# Patient Record
Sex: Male | Born: 1980 | Race: White | Hispanic: No | Smoking: Never smoker
Health system: Southern US, Community
[De-identification: ages and names within clinical notes are randomized; demographics above are authoritative.]

## PROBLEM LIST (undated history)

## (undated) DIAGNOSIS — F1910 Other psychoactive substance abuse, uncomplicated: Secondary | ICD-10-CM

## (undated) DIAGNOSIS — J939 Pneumothorax, unspecified: Secondary | ICD-10-CM

## (undated) DIAGNOSIS — E119 Type 2 diabetes mellitus without complications: Secondary | ICD-10-CM

## (undated) HISTORY — PX: APPENDECTOMY: SHX54

## (undated) HISTORY — PX: HAND SURGERY: SHX662

---

## 2018-05-03 ENCOUNTER — Encounter (HOSPITAL_COMMUNITY): Payer: Self-pay | Admitting: Emergency Medicine

## 2018-05-03 ENCOUNTER — Emergency Department (HOSPITAL_COMMUNITY)
Admission: EM | Admit: 2018-05-03 | Discharge: 2018-05-03 | Disposition: A | Discharge status: Home / Self Care | Payer: Medicaid Other | Attending: Emergency Medicine | Admitting: Emergency Medicine

## 2018-05-03 DIAGNOSIS — Z5321 Procedure and treatment not carried out due to patient leaving prior to being seen by health care provider: Secondary | ICD-10-CM | POA: Diagnosis not present

## 2018-05-03 DIAGNOSIS — F1010 Alcohol abuse, uncomplicated: Secondary | ICD-10-CM | POA: Diagnosis not present

## 2018-05-03 DIAGNOSIS — F1910 Other psychoactive substance abuse, uncomplicated: Secondary | ICD-10-CM | POA: Insufficient documentation

## 2018-05-03 NOTE — ED Triage Notes (Signed)
Per pt, states he was sent here by [facility] for Detox-states he has been using Fentanyl, cocaine, and ETOH

## 2018-05-12 ENCOUNTER — Other Ambulatory Visit: Payer: Self-pay

## 2018-05-12 ENCOUNTER — Emergency Department (HOSPITAL_COMMUNITY)
Admission: EM | Admit: 2018-05-12 | Discharge: 2018-05-13 | Disposition: A | Discharge status: Home / Self Care | Payer: Medicaid Other | Attending: Emergency Medicine | Admitting: Emergency Medicine

## 2018-05-12 ENCOUNTER — Emergency Department (HOSPITAL_COMMUNITY): Payer: Medicaid Other

## 2018-05-12 ENCOUNTER — Encounter (HOSPITAL_COMMUNITY): Payer: Self-pay | Admitting: Emergency Medicine

## 2018-05-12 DIAGNOSIS — J209 Acute bronchitis, unspecified: Secondary | ICD-10-CM | POA: Diagnosis not present

## 2018-05-12 DIAGNOSIS — R0602 Shortness of breath: Secondary | ICD-10-CM | POA: Diagnosis present

## 2018-05-12 HISTORY — DX: Pneumothorax, unspecified: J93.9

## 2018-05-12 LAB — CBC WITH DIFFERENTIAL/PLATELET
BASOS ABS: 0 10*3/uL (ref 0.0–0.1)
Basophils Relative: 0 %
EOS PCT: 1 %
Eosinophils Absolute: 0.2 10*3/uL (ref 0.0–0.7)
HEMATOCRIT: 41.3 % (ref 39.0–52.0)
Hemoglobin: 14.2 g/dL (ref 13.0–17.0)
LYMPHS ABS: 1.9 10*3/uL (ref 0.7–4.0)
LYMPHS PCT: 18 %
MCH: 31.3 pg (ref 26.0–34.0)
MCHC: 34.4 g/dL (ref 30.0–36.0)
MCV: 91.2 fL (ref 78.0–100.0)
MONO ABS: 0.6 10*3/uL (ref 0.1–1.0)
MONOS PCT: 6 %
NEUTROS ABS: 7.8 10*3/uL — AB (ref 1.7–7.7)
Neutrophils Relative %: 75 %
Platelets: 174 10*3/uL (ref 150–400)
RBC: 4.53 MIL/uL (ref 4.22–5.81)
RDW: 12.7 % (ref 11.5–15.5)
WBC: 10.4 10*3/uL (ref 4.0–10.5)

## 2018-05-12 MED ORDER — METHYLPREDNISOLONE SODIUM SUCC 125 MG IJ SOLR
125.0000 mg | Freq: Once | INTRAMUSCULAR | Status: AC
  Administered 2018-05-12: 125 mg via INTRAVENOUS
  Filled 2018-05-12: qty 2

## 2018-05-12 MED ORDER — ALBUTEROL SULFATE (2.5 MG/3ML) 0.083% IN NEBU
2.5000 mg | INHALATION_SOLUTION | Freq: Once | RESPIRATORY_TRACT | Status: AC
  Administered 2018-05-12: 2.5 mg via RESPIRATORY_TRACT
  Filled 2018-05-12: qty 3

## 2018-05-12 MED ORDER — IPRATROPIUM-ALBUTEROL 0.5-2.5 (3) MG/3ML IN SOLN
3.0000 mL | Freq: Once | RESPIRATORY_TRACT | Status: AC
  Administered 2018-05-12: 3 mL via RESPIRATORY_TRACT
  Filled 2018-05-12: qty 3

## 2018-05-12 NOTE — ED Notes (Signed)
Bed: [id number] Expected date:  Expected time:  Means of arrival:  Comments: EMS 37 yo male weak and dizzy from work SOB/wheezing-neb treatment

## 2018-05-12 NOTE — ED Provider Notes (Signed)
[facility]-EMERGENCY DEPT Provider Note   CSN: [id number] Arrival date & time: 05/12/18  2307     History   Chief Complaint Chief Complaint  Patient presents with  . Dizziness  . Weakness  . Shortness of Breath    HPI Dillon Carson is a 37 y.o. male.  Patient presents to the emergency department for evaluation of chest tightness, shortness of breath, wheezing and cough.  Symptoms began this afternoon while at work.  Patient reports that he has nasal congestion and he has been coughing up thick sputum.  Every time he tries to take a deep breath it causes him to cough and he feels like he cannot catch his breath.  He has not had a fever.     Past Medical History:  Diagnosis Date  . Pneumothorax     There are no active problems to display for this patient.       Home Medications    Prior to Admission medications   Medication Sig Start Date End Date Taking? Authorizing Provider  benzonatate (TESSALON) 100 MG capsule Take 1 capsule (100 mg total) by mouth every 8 (eight) hours. 05/13/18   Gilda CreasePollina, Estrella Alcaraz J, MD  predniSONE (DELTASONE) 20 MG tablet Take 3 tablets (60 mg total) by mouth daily with breakfast. 05/13/18   Joleene Burnham, Canary Brimhristopher J, MD    Family History No family history on file.  Social History Social History   Tobacco Use  . Smoking status: Not on file  Substance Use Topics  . Alcohol use: Yes  . Drug use: Yes    Types: Cocaine    Comment: fentalnyl     Allergies   Penicillins   Review of Systems Review of Systems  Respiratory: Positive for cough, chest tightness and shortness of breath.   All other systems reviewed and are negative.    Physical Exam Updated Vital Signs BP 118/76   Pulse 84   Temp 98.3 F (36.8 C) (Oral)   Resp 16   Ht 6\' 1"  (1.854 m)   Wt 77.1 kg   SpO2 93%   BMI 22.43 kg/m   Physical Exam  Constitutional: He is oriented to person, place, and time. He appears well-developed and  well-nourished. No distress.  HENT:  Head: Normocephalic and atraumatic.  Right Ear: Hearing normal.  Left Ear: Hearing normal.  Nose: Nose normal.  Mouth/Throat: Oropharynx is clear and moist and mucous membranes are normal.  Eyes: Pupils are equal, round, and reactive to light. Conjunctivae and EOM are normal.  Neck: Normal range of motion. Neck supple.  Cardiovascular: Regular rhythm, S1 normal and S2 normal. Exam reveals no gallop and no friction rub.  No murmur heard. Pulmonary/Chest: Effort normal. No respiratory distress. He has decreased breath sounds. He has wheezes (throughout). He exhibits no tenderness.  Abdominal: Soft. Normal appearance and bowel sounds are normal. There is no hepatosplenomegaly. There is no tenderness. There is no rebound, no guarding, no tenderness at McBurney's point and negative Murphy's sign. No hernia.  Musculoskeletal: Normal range of motion.  Neurological: He is alert and oriented to person, place, and time. He has normal strength. No cranial nerve deficit or sensory deficit. Coordination normal. GCS eye subscore is 4. GCS verbal subscore is 5. GCS motor subscore is 6.  Skin: Skin is warm, dry and intact. No rash noted. No cyanosis.  Psychiatric: He has a normal mood and affect. His speech is normal and behavior is normal. Thought content normal.  Nursing note and vitals  reviewed.    ED Treatments / Results  Labs (all labs ordered are listed, but only abnormal results are displayed) Labs Reviewed  CBC WITH DIFFERENTIAL/PLATELET - Abnormal; Notable for the following components:      Result Value   Neutro Abs 7.8 (*)    All other components within normal limits  BASIC METABOLIC PANEL - Abnormal; Notable for the following components:   Potassium 3.4 (*)    Glucose, Bld 138 (*)    All other components within normal limits  I-STAT TROPONIN, ED    EKG None  Radiology Dg Chest 2 View  Result Date: 05/13/2018 CLINICAL DATA:  Cough and shortness  of breath EXAM: CHEST - 2 VIEW COMPARISON:  None. FINDINGS: Monitoring leads overlie the patient. Normal cardiac and mediastinal contours. Bilateral mid and lower lung interstitial opacities. No pleural effusion or pneumothorax. Osseous structures unremarkable. IMPRESSION: Interstitial opacities mid and lower lungs bilaterally may represent bronchitis. Electronically Signed   By: Annia Belt M.D.   On: 05/13/2018 00:05    Procedures Procedures (including critical care time)  Medications Ordered in ED Medications  albuterol (PROVENTIL HFA;VENTOLIN HFA) 108 (90 Base) MCG/ACT inhaler 2 puff (has no administration in time range)  methylPREDNISolone sodium succinate (SOLU-MEDROL) 125 mg/2 mL injection 125 mg (125 mg Intravenous Given 05/12/18 2340)  ipratropium-albuterol (DUONEB) 0.5-2.5 (3) MG/3ML nebulizer solution 3 mL (3 mLs Nebulization Given 05/12/18 2357)  albuterol (PROVENTIL) (2.5 MG/3ML) 0.083% nebulizer solution 2.5 mg (2.5 mg Nebulization Given 05/12/18 2358)     Initial Impression / Assessment and Plan / ED Course  I have reviewed the triage vital signs and the nursing notes.  Pertinent labs & imaging results that were available during my care of the patient were reviewed by me and considered in my medical decision making (see chart for details).     Patient presents to the emergency department for evaluation of shortness of breath.  Patient has active wheezing and bronchospasm evident on examination.  No evidence of pneumonia on x-ray.  Blood work unremarkable.  Cardiac evaluation unremarkable.  Will treat for acute bronchitis.  Final Clinical Impressions(s) / ED Diagnoses   Final diagnoses:  Acute bronchitis, unspecified organism    ED Discharge Orders         Ordered    predniSONE (DELTASONE) 20 MG tablet  Daily with breakfast     05/13/18 0113    benzonatate (TESSALON) 100 MG capsule  Every 8 hours     05/13/18 0113           Gilda Crease, MD 05/13/18  [phone]

## 2018-05-12 NOTE — ED Triage Notes (Signed)
While patient was at work, around [employer]3pm, he noticed weakness and dizziness. The weakness and dizziness worsened around 9 pm and he also began to have shortness of breath and coughed up yellow sputum. He reports his roommate has also had a productive cough. Along with the cough he reports chest tightness and wheezing was noted. Albuterol and solumedrol was administered by EMS.

## 2018-05-13 LAB — BASIC METABOLIC PANEL
ANION GAP: 10 (ref 5–15)
BUN: 12 mg/dL (ref 6–20)
CALCIUM: 9.2 mg/dL (ref 8.9–10.3)
CO2: 25 mmol/L (ref 22–32)
Chloride: 105 mmol/L (ref 98–111)
Creatinine, Ser: 0.89 mg/dL (ref 0.61–1.24)
GFR calc Af Amer: >60 mL/min (ref >60)
GFR calc non Af Amer: >60 mL/min (ref >60)
GLUCOSE: 138 mg/dL — AB (ref 70–99)
Potassium: 3.4 mmol/L — ABNORMAL LOW (ref 3.5–5.1)
Sodium: 140 mmol/L (ref 135–145)

## 2018-05-13 LAB — I-STAT TROPONIN, ED: Troponin i, poc: 0 ng/mL (ref 0.00–0.08)

## 2018-05-13 MED ORDER — PREDNISONE 20 MG PO TABS: 60.0000 mg | ORAL_TABLET | Freq: Every day | ORAL | 0 refills | Status: DC

## 2018-05-13 MED ORDER — ALBUTEROL SULFATE HFA 108 (90 BASE) MCG/ACT IN AERS
2.0000 | INHALATION_SPRAY | RESPIRATORY_TRACT | Status: DC | PRN
  Administered 2018-05-13: 2 via RESPIRATORY_TRACT
  Filled 2018-05-13: qty 6.7

## 2018-05-13 MED ORDER — BENZONATATE 100 MG PO CAPS: 100.0000 mg | ORAL_CAPSULE | Freq: Three times a day (TID) | ORAL | 0 refills | Status: DC

## 2018-06-30 ENCOUNTER — Encounter (HOSPITAL_COMMUNITY): Payer: Self-pay | Admitting: Emergency Medicine

## 2018-06-30 ENCOUNTER — Emergency Department (HOSPITAL_COMMUNITY)
Admission: EM | Admit: 2018-06-30 | Discharge: 2018-06-30 | Discharge status: Against Medical Advice | Payer: Medicaid Other | Attending: Emergency Medicine | Admitting: Emergency Medicine

## 2018-06-30 ENCOUNTER — Other Ambulatory Visit: Payer: Self-pay

## 2018-06-30 ENCOUNTER — Emergency Department (HOSPITAL_COMMUNITY): Payer: Medicaid Other

## 2018-06-30 DIAGNOSIS — R112 Nausea with vomiting, unspecified: Secondary | ICD-10-CM | POA: Diagnosis not present

## 2018-06-30 DIAGNOSIS — R197 Diarrhea, unspecified: Secondary | ICD-10-CM | POA: Insufficient documentation

## 2018-06-30 HISTORY — DX: Other psychoactive substance abuse, uncomplicated: F19.10

## 2018-06-30 MED ORDER — ONDANSETRON HCL 4 MG/2ML IJ SOLN
4.0000 mg | INTRAMUSCULAR | Status: DC | PRN
  Filled 2018-06-30: qty 2

## 2018-06-30 MED ORDER — SODIUM CHLORIDE 0.9 % IV BOLUS: 1000.0000 mL | Freq: Once | INTRAVENOUS | Status: DC

## 2018-06-30 MED ORDER — DICYCLOMINE HCL 10 MG/ML IM SOLN
20.0000 mg | Freq: Once | INTRAMUSCULAR | Status: DC
  Filled 2018-06-30: qty 2

## 2018-06-30 MED ORDER — FAMOTIDINE IN NACL 20-0.9 MG/50ML-% IV SOLN
20.0000 mg | Freq: Once | INTRAVENOUS | Status: DC
  Filled 2018-06-30: qty 50

## 2018-06-30 NOTE — Discharge Instructions (Addendum)
Increase your fluid intake (ie:  Gatoraide) for the next few days, as discussed.  Eat a bland diet and advance to your regular diet slowly as you can tolerate it.   Avoid full strength juices, as well as milk and milk products until your diarrhea has resolved.   Call your regular medical doctor Monday to schedule a follow up appointment this week.  Return to the Emergency Department immediately sooner if you change your mind regarding evaluation.

## 2018-06-30 NOTE — ED Triage Notes (Signed)
Pt states that yesterday he felt like he had a cold. Pt states this morning at 0300, he started throwing up and having diarrhea. Pt states 3 episodes of emesis, 1130p-5am diarrhea "nonstop". Pt states he has been taking loperamide without relief.  Pt states that he is also out of his albuterol inhaler.

## 2018-06-30 NOTE — ED Provider Notes (Signed)
[facility]-EMERGENCY DEPT Provider Note   CSN: [id number] Arrival date & time: 06/30/18  1044     History   Chief Complaint Chief Complaint  Patient presents with  . Emesis  . Diarrhea    HPI Dillon Carson is a 37 y.o. male.  HPI  Pt was seen at 1150. Per pt, c/o gradual onset and persistence of multiple intermittent episodes of N/V/D that began last night approximately 2300. Describes the stools as "watery."  Pt states his roommate recently had the same symptoms. Pt also states he has had "a cough for a while" and has run out of his albuterol MDI. Pt is requesting a refill of his MDI. Denies abd pain, no CP/SOB, no back pain, no fevers, no black or blood in stools or emesis.    Past Medical History:  Diagnosis Date  . Pneumothorax   . Polysubstance abuse (HCC)     There are no active problems to display for this patient.   Past Surgical History:  Procedure Laterality Date  . APPENDECTOMY    . HAND SURGERY          Home Medications    Prior to Admission medications   Not on File    Family History No family history on file.  Social History Social History   Tobacco Use  . Smoking status: Never Smoker  . Smokeless tobacco: Never Used  Substance Use Topics  . Alcohol use: Yes  . Drug use: Not Currently    Types: Cocaine    Comment: pt denies 10/19     Allergies   Penicillins   Review of Systems Review of Systems ROS: Statement: All systems negative except as marked or noted in the HPI; Constitutional: Negative for fever and chills. ; ; Eyes: Negative for eye pain, redness and discharge. ; ; ENMT: Negative for ear pain, hoarseness, nasal congestion, sinus pressure and sore throat. ; ; Cardiovascular: Negative for chest pain, palpitations, diaphoresis, dyspnea and peripheral edema. ; ; Respiratory: +cough. Negative for wheezing and stridor. ; ; Gastrointestinal: +N/V/D. Negative for abdominal pain, blood in stool, hematemesis,  jaundice and rectal bleeding. . ; ; Genitourinary: Negative for dysuria, flank pain and hematuria. ; ; Musculoskeletal: Negative for back pain and neck pain. Negative for swelling and trauma.; ; Skin: Negative for pruritus, rash, abrasions, blisters, bruising and skin lesion.; ; Neuro: Negative for headache, lightheadedness and neck stiffness. Negative for weakness, altered level of consciousness, altered mental status, extremity weakness, paresthesias, involuntary movement, seizure and syncope.       Physical Exam Updated Vital Signs BP 119/85 (BP Location: Right Arm)   Pulse 74   Temp 97.6 F (36.4 C) (Oral)   Resp 16   Ht 6\' 1"  (1.854 m)   Wt 84.8 kg   SpO2 98%   BMI 24.67 kg/m   Physical Exam 1155: Physical examination:  Nursing notes reviewed; Vital signs and O2 SAT reviewed;  Constitutional: Well developed, Well nourished, Well hydrated, In no acute distress; Head:  Normocephalic, atraumatic; Eyes: EOMI, PERRL, No scleral icterus; ENMT: Mouth and pharynx normal, Mucous membranes moist; Neck: Supple, Full range of motion, No lymphadenopathy; Cardiovascular: Regular rate and rhythm, No gallop; Respiratory: Breath sounds clear & equal bilaterally, No wheezes.  Speaking full sentences with ease, Normal respiratory effort/excursion; Chest: Nontender, Movement normal; Abdomen: Soft, Nontender, Nondistended, Normal bowel sounds; Genitourinary: No CVA tenderness; Extremities: Peripheral pulses normal, No tenderness, No edema, No calf edema or asymmetry.; Neuro: AA&Ox3, Major CN grossly intact.  Speech clear. No gross focal motor or sensory deficits in extremities.; Skin: Color normal, Warm, Dry.   ED Treatments / Results  Labs (all labs ordered are listed, but only abnormal results are displayed)   EKG None  Radiology   Procedures Procedures (including critical care time)  Medications Ordered in ED Medications  sodium chloride 0.9 % bolus 1,000 mL (has no administration in time  range)  famotidine (PEPCID) IVPB 20 mg premix (has no administration in time range)  dicyclomine (BENTYL) injection 20 mg (has no administration in time range)  ondansetron (ZOFRAN) injection 4 mg (has no administration in time range)     Initial Impression / Assessment and Plan / ED Course  I have reviewed the triage vital signs and the nursing notes.  Pertinent labs & imaging results that were available during my care of the patient were reviewed by me and considered in my medical decision making (see chart for details).  MDM Reviewed: previous chart, nursing note and vitals Reviewed previous: labs    1245:  Pt does not want to stay, stating to ED RN that he needed to leave right now. ED RN and I encouraged pt to stay, continues to refuse.  Pt makes his own medical decisions.  Risks of AMA explained to pt, including, but not limited to: bowel obstruction, colitis, pneumonia, pneumothorax, stroke, heart attack, cardiac arrythmia ("irregular heart rate/beat"), "passing out," temporary and/or permanent disability, death.  Pt verb understanding and continues to refuse to stay for further evaluation, understanding the consequences of his decision.  I encouraged pt to follow up with his PMD tomorrow and return to the ED immediately if symptoms worsen, or for any other concerns.  Pt verb understanding, agreeable.    Final Clinical Impressions(s) / ED Diagnoses   Final diagnoses:  None    ED Discharge Orders    None       Samuel Jester, DO 07/01/18 6578

## 2019-02-27 ENCOUNTER — Other Ambulatory Visit: Payer: Self-pay

## 2019-02-27 ENCOUNTER — Encounter (HOSPITAL_COMMUNITY): Payer: Self-pay

## 2019-02-27 ENCOUNTER — Emergency Department (HOSPITAL_COMMUNITY)
Admission: EM | Admit: 2019-02-27 | Discharge: 2019-02-27 | Disposition: A | Discharge status: Home / Self Care | Payer: Medicaid Other | Attending: Emergency Medicine | Admitting: Emergency Medicine

## 2019-02-27 DIAGNOSIS — E162 Hypoglycemia, unspecified: Secondary | ICD-10-CM | POA: Insufficient documentation

## 2019-02-27 LAB — CBG MONITORING, ED: Glucose-Capillary: 109 mg/dL — ABNORMAL HIGH (ref 70–99)

## 2019-02-27 MED ORDER — FREESTYLE LANCETS MISC: 12 refills | Status: AC

## 2019-02-27 NOTE — ED Triage Notes (Signed)
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 Patient states he does not have any lancets or oral glucose tabs for a while. Patient states he drank [facility] and CBG went to 157 and he felt better. Patient states his job made him come and get a work note.  CBG in triage-109.

## 2019-02-27 NOTE — ED Provider Notes (Signed)
[facility]-EMERGENCY DEPT Provider Note   CSN: [id number] Arrival date & time: 02/27/19  1145    History   Chief Complaint Chief Complaint  Patient presents with  . Hypoglycemia    HPI Dillon Carson is a 38 y.o. male.     38yo male presents for hypoglycemic episode while at work today. Patient does not have diabetes, has hypoglycemic episodes and carries a glucometer and glucose tabs with him. 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 No complaints at this time, denies fevers, sick symptoms or other complaints.      Past Medical History:  Diagnosis Date  . Pneumothorax   . Polysubstance abuse (HCC)     There are no active problems to display for this patient.   Past Surgical History:  Procedure Laterality Date  . APPENDECTOMY    . HAND SURGERY          Home Medications    Prior to Admission medications   Medication Sig Start Date End Date Taking? Authorizing Provider  Lancets (FREESTYLE) lancets Use as instructed 02/27/19   Jeannie FendMurphy, Serina Nichter A, PA-C    Family History Family History  Problem Relation Age of Onset  . Diabetes Mother   . Heart failure Mother     Social History Social History   Tobacco Use  . Smoking status: Never Smoker  . Smokeless tobacco: Never Used  Substance Use Topics  . Alcohol use: Not Currently  . Drug use: Not Currently    Types: Cocaine    Comment: Patient  states he is clean x 26 months     Allergies   Penicillins   Review of Systems Review of Systems  Constitutional: Negative for fever.  Gastrointestinal: Negative for nausea and vomiting.  Skin: Negative for rash and wound.  Allergic/Immunologic: Negative for immunocompromised state.  Neurological: Negative for dizziness and weakness.  All other systems reviewed and are negative.     Physical Exam Updated Vital Signs BP (!) 127/95 (BP Location: Left Arm)   Pulse 82   Temp 98 F (36.7 C) (Oral)   Resp 17   Ht 6\' 2"  (1.88 m)   Wt 88.5 kg   SpO2 100%   BMI 25.04 kg/m   Physical Exam Vitals signs and nursing note reviewed.  Constitutional:      General: He is not in acute distress.    Appearance: He is well-developed. He is not diaphoretic.  HENT:     Head: Normocephalic and atraumatic.  Cardiovascular:     Rate and Rhythm: Normal rate and regular rhythm.     Heart sounds: Normal heart sounds.  Pulmonary:     Effort: Pulmonary effort is normal.     Breath sounds: Normal breath sounds.  Skin:    General: Skin is warm and dry.  Neurological:     Mental Status: He is alert and oriented to person, place, and time.  Psychiatric:        Behavior: Behavior normal.      ED Treatments / Results  Labs (all labs ordered are listed, but only abnormal results are displayed) Labs Reviewed  CBG MONITORING, ED - Abnormal; Notable for the following components:      Result Value   Glucose-Capillary 109 (*)    All other components within normal limits  EKG None  Radiology No results found.  Procedures Procedures (including critical care time)  Medications Ordered in ED Medications - No data to display   Initial Impression / Assessment and Plan / ED Course  I have reviewed the triage vital signs and the nursing notes.  Pertinent labs & imaging results that were available during my care of the patient were reviewed by me and considered in my medical decision making (see chart for details).  Clinical Course as of Feb 26 1325  Wed Feb 26, 7886  2155 38 year old male with complaint of hypoglycemic episode while at work today.  Patient is out of lancets, unable to check his blood sugar when the episode happened however drinking [facility] and feels better at this time however requires note for his employer to build to return to work.  Patient was given  prescription for lancets, he will carry glucose tabs and candies with him.  Glucose in triage was 109.  Patient would like to be discharged at this time.   [LM]    Clinical Course User Index [LM] Tacy Learn, PA-C      Final Clinical Impressions(s) / ED Diagnoses   Final diagnoses:  Hypoglycemia    ED Discharge Orders         Ordered    Lancets (FREESTYLE) lancets     02/27/19 1321           Tacy Learn, PA-C 02/27/19 [place], [place], DO 02/28/19 [phone]

## 2019-02-27 NOTE — Discharge Instructions (Signed)
Be sure to keep plenty of glucose tabs, candy, sugar packets available for hypoglycemic events. Follow up with your doctor as needed.

## 2020-01-05 ENCOUNTER — Encounter (HOSPITAL_COMMUNITY): Payer: Self-pay | Admitting: Emergency Medicine

## 2020-01-05 ENCOUNTER — Other Ambulatory Visit: Payer: Self-pay

## 2020-01-05 DIAGNOSIS — R109 Unspecified abdominal pain: Secondary | ICD-10-CM | POA: Insufficient documentation

## 2020-01-05 DIAGNOSIS — Z5321 Procedure and treatment not carried out due to patient leaving prior to being seen by health care provider: Secondary | ICD-10-CM | POA: Insufficient documentation

## 2020-01-05 DIAGNOSIS — R21 Rash and other nonspecific skin eruption: Secondary | ICD-10-CM | POA: Insufficient documentation

## 2020-01-05 MED ORDER — SODIUM CHLORIDE 0.9% FLUSH: 3.0000 mL | Freq: Once | INTRAVENOUS | Status: DC

## 2020-01-05 NOTE — ED Triage Notes (Signed)
Pt reports after laying down he gets bad pressure and feels like has to burp then reports spits up bright red blood. Reports been taking Tums without relief. Also has rash on bilat thighs for week. Pt also takes lots of Goody powders.

## 2020-01-06 ENCOUNTER — Emergency Department (HOSPITAL_COMMUNITY)
Admission: EM | Admit: 2020-01-06 | Discharge: 2020-01-06 | Disposition: A | Discharge status: Home / Self Care | Payer: Medicaid Other | Attending: Emergency Medicine | Admitting: Emergency Medicine

## 2020-02-04 ENCOUNTER — Encounter (HOSPITAL_COMMUNITY): Payer: Self-pay | Admitting: Emergency Medicine

## 2020-02-04 ENCOUNTER — Emergency Department (HOSPITAL_COMMUNITY)
Admission: EM | Admit: 2020-02-04 | Discharge: 2020-02-04 | Disposition: A | Discharge status: Home / Self Care | Payer: Medicaid Other | Attending: Emergency Medicine | Admitting: Emergency Medicine

## 2020-02-04 ENCOUNTER — Other Ambulatory Visit: Payer: Self-pay

## 2020-02-04 DIAGNOSIS — E162 Hypoglycemia, unspecified: Secondary | ICD-10-CM

## 2020-02-04 LAB — CBG MONITORING, ED: Glucose-Capillary: 105 mg/dL — ABNORMAL HIGH (ref 70–99)

## 2020-02-04 MED ORDER — SODIUM CHLORIDE 0.9 % IV BOLUS
1000.0000 mL | Freq: Once | INTRAVENOUS | Status: AC
  Administered 2020-02-04: 1000 mL via INTRAVENOUS

## 2020-02-04 NOTE — ED Triage Notes (Signed)
Patient here from work reporting hypoglycemic episode. Reports hx of same. States that he took 2 glucose tabs and rechecked sugar. Went up to 104. Also reports that BP was 206/100s.

## 2020-02-04 NOTE — ED Provider Notes (Signed)
[facility]-EMERGENCY DEPT Provider Note   CSN: [id number] Arrival date & time: 02/04/20  1130     History Chief Complaint  Patient presents with  . Hypoglycemia  . Hypertension    Dillon Carson is a 39 y.o. male.  HPI 39 year old male with history of polysubstance abuse, glycemia presents to the ER for a hypoglycemic episode.  Patient states that he woke up this morning in his usual state of health and went to work.  He did not eat breakfast this morning.  Shortly after he arrived to work, he started to feel weak, dizzy and lightheaded which is consistent with how he feels when he is hypoglycemic.  He then stated that he took 2 glucose tablets, ate some cheese and crackers, tolerated and walked around work.  Patient started noting improvement in his symptoms.  He took his blood sugar and states that it was 206.  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  He denies any hypertension as per triage notes, states that they misunderstood him.  He notes improvement in his symptoms, though he does state that he still feels a bit dehydrated.  He denies any fevers, chills, nausea, vomiting, abdominal pain.  He states that he has episodes of hypoglycemia, though his last episode was approximately 5 months ago as he has been pretty good about monitoring it.    Past Medical History:  Diagnosis Date  . Pneumothorax   . Polysubstance abuse (HCC)     There are no problems to display for this patient.   Past Surgical History:  Procedure Laterality Date  . APPENDECTOMY    . HAND SURGERY         Family History  Problem Relation Age of Onset  . Diabetes Mother   . Heart failure Mother     Social History   Tobacco Use  . Smoking status: Never Smoker  . Smokeless tobacco: Never Used  Substance Use Topics  . Alcohol use: Not Currently  . Drug use: Not Currently    Types: Cocaine    Comment:  Patient  states he is clean x 26 months    Home Medications Prior to Admission medications   Medication Sig Start Date End Date Taking? Authorizing Provider  Lancets (FREESTYLE) lancets Use as instructed 02/27/19   Jeannie Fend, PA-C    Allergies    Penicillins  Review of Systems   Review of Systems  Constitutional: Negative for chills and fever.  HENT: Negative for ear pain and sore throat.   Eyes: Negative for pain and visual disturbance.  Respiratory: Negative for cough and shortness of breath.   Cardiovascular: Negative for chest pain and palpitations.  Gastrointestinal: Negative for abdominal pain, nausea and vomiting.  Genitourinary: Negative for dysuria and hematuria.  Musculoskeletal: Negative for arthralgias and back pain.  Skin: Negative for color change and rash.  Neurological: Positive for dizziness, weakness and light-headedness. Negative for seizures and syncope.  Psychiatric/Behavioral: Negative for confusion.  All other systems reviewed and are negative.   Physical Exam Updated Vital Signs BP (!) 146/95   Pulse 66   Temp 98.4 F (36.9 C) (Oral)   Resp 18   SpO2 97%   Physical Exam Vitals and nursing note reviewed.  Constitutional:      General: He is not in acute distress.    Appearance: Normal appearance. He is well-developed and normal  weight. He is not ill-appearing, toxic-appearing or diaphoretic.  HENT:     Head: Normocephalic and atraumatic.     Nose: Nose normal.     Mouth/Throat:     Mouth: Mucous membranes are moist.     Pharynx: Oropharynx is clear.  Eyes:     Conjunctiva/sclera: Conjunctivae normal.     Pupils: Pupils are equal, round, and reactive to light.  Cardiovascular:     Rate and Rhythm: Normal rate and regular rhythm.     Pulses: Normal pulses.     Heart sounds: Normal heart sounds. No murmur.  Pulmonary:     Effort: Pulmonary effort is normal. No respiratory distress.     Breath sounds: Normal breath sounds.  Abdominal:       General: Abdomen is flat.     Palpations: Abdomen is soft.     Tenderness: There is no abdominal tenderness.  Musculoskeletal:        General: No tenderness.     Cervical back: Neck supple.     Right lower leg: No edema.     Left lower leg: No edema.  Skin:    General: Skin is warm and dry.     Capillary Refill: Capillary refill takes less than 2 seconds.  Neurological:     General: No focal deficit present.     Mental Status: He is alert and oriented to person, place, and time.  Psychiatric:        Mood and Affect: Mood normal.        Behavior: Behavior normal.     ED Results / Procedures / Treatments   Labs (all labs ordered are listed, but only abnormal results are displayed) Labs Reviewed  CBG MONITORING, ED - Abnormal; Notable for the following components:      Result Value   Glucose-Capillary 105 (*)    All other components within normal limits    EKG EKG Interpretation  Date/Time:  Tuesday Feb 04 2020 12:35:57 EDT Ventricular Rate:  66 PR Interval:    QRS Duration: 85 QT Interval:  375 QTC Calculation: 393 R Axis:   73 Text Interpretation: Sinus rhythm 12 Lead; Mason-Likar unremarkable ECG Confirmed by Gerhard Munch [phone]) on 02/04/2020 12:43:04 PM   Radiology No results found.  Procedures Procedures (including critical care time)  Medications Ordered in ED Medications  sodium chloride 0.9 % bolus 1,000 mL (1,000 mLs Intravenous Bolus from Bag 02/04/20 1304)    ED Course  I have reviewed the triage vital signs and the nursing notes.  Pertinent labs & imaging results that were available during my care of the patient were reviewed by me and considered in my medical decision making (see chart for details).    MDM Rules/Calculators/A&P                      39 year old male with hypoglycemic episode presents to the ER. Presentation to the ER, he patient is alert and oriented, no acute distress, nontoxic appearing.  Physical exam without  abdominal tenderness or any acute abnormality.  Vitals overall reassuring, blood pressure 146/95.  EKG normal sinus rhythm.  Blood sugar improved to 104 in the ER.  Patient reports improvement of symptoms, will give fluid bolus as he states he still feels dehydrated.  No evidence of hyperglycemic crisis.  Patient does not have a PCP, encouraged him to establish with one through the phone number on his discharge paperwork.  Encouraged follow-up with the PCP.  Patient voices understanding  and is agreeable to this plan.  At this stage in the ED course the patient is medically screened and is stable for discharge.  Return precautions given.   Final Clinical Impression(s) / ED Diagnoses Final diagnoses:  Hypoglycemia    Rx / DC Orders ED Discharge Orders    None       Lyndel Safe 02/04/20 1329    Carmin Muskrat, MD 02/05/20 1108

## 2020-02-04 NOTE — ED Notes (Signed)
An After Visit Summary was printed and given to the patient. Discharge instructions given and no further questions at this time. Pt denies chest pain and SOB at this time.  

## 2020-02-04 NOTE — Discharge Instructions (Addendum)
Please call the phone number in your discharge paperwork to establish with a primary care doctor.  Return to the ER if your symptoms worsen.

## 2020-02-05 ENCOUNTER — Other Ambulatory Visit: Payer: Self-pay

## 2020-02-05 ENCOUNTER — Encounter (HOSPITAL_COMMUNITY): Payer: Self-pay

## 2020-02-05 ENCOUNTER — Emergency Department (HOSPITAL_COMMUNITY)
Admission: EM | Admit: 2020-02-05 | Discharge: 2020-02-05 | Disposition: A | Discharge status: Home / Self Care | Payer: Medicaid Other | Attending: Emergency Medicine | Admitting: Emergency Medicine

## 2020-02-05 DIAGNOSIS — E162 Hypoglycemia, unspecified: Secondary | ICD-10-CM

## 2020-02-05 DIAGNOSIS — Z7984 Long term (current) use of oral hypoglycemic drugs: Secondary | ICD-10-CM | POA: Diagnosis not present

## 2020-02-05 DIAGNOSIS — E11649 Type 2 diabetes mellitus with hypoglycemia without coma: Secondary | ICD-10-CM | POA: Diagnosis not present

## 2020-02-05 HISTORY — DX: Type 2 diabetes mellitus without complications: E11.9

## 2020-02-05 LAB — CBG MONITORING, ED
Glucose-Capillary: 113 mg/dL — ABNORMAL HIGH (ref 70–99)
Glucose-Capillary: 72 mg/dL (ref 70–99)
Glucose-Capillary: 82 mg/dL (ref 70–99)

## 2020-02-05 NOTE — ED Triage Notes (Signed)
Patient states at 0815 his blood sugar was 59. Patient states he took a glucose tablet at that time. Patient states at 0845 his CBG-245. 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 CBG in triage- 113.

## 2020-02-05 NOTE — ED Provider Notes (Signed)
[facility] DEPT Provider Note   CSN: [id number] Arrival date & time: 02/05/20  4650     History Chief Complaint  Patient presents with  . Hypoglycemia    Dillon Carson is a 39 y.o. male.  The history is provided by the patient.  Hypoglycemia Initial blood sugar:  59 Blood sugar after intervention:  234 Severity:  Mild Onset quality:  Sudden Timing:  Intermittent Progression:  Waxing and waning Chronicity:  Recurrent Diabetic status:  Non-diabetic Context comment:  History of low blood sugars, takes sugar tablets as needed. Never has passed out before due to low sugar. Does not know why it happens. No PCP.  Relieved by:  Oral glucose Associated symptoms: no altered mental status, no anxiety, no decreased responsiveness, no dizziness, no obesity, no seizures, no shortness of breath, no speech difficulty, no sweats, no tremors, no visual change, no vomiting and no weakness   Risk factors: drug use (per chart review) and family hx of diabetes   Risk factors: no family hx of hypoglycemia        Past Medical History:  Diagnosis Date  . Diabetes mellitus without complication ([place])   . Pneumothorax   . Polysubstance abuse ([place])     There are no problems to display for this patient.   Past Surgical History:  Procedure Laterality Date  . APPENDECTOMY    . HAND SURGERY         Family History  Problem Relation Age of Onset  . Diabetes Mother   . Heart failure Mother     Social History   Tobacco Use  . Smoking status: Never Smoker  . Smokeless tobacco: Never Used  Substance Use Topics  . Alcohol use: Not Currently  . Drug use: Not Currently    Types: Cocaine    Comment: Patient  states he is clean x 26 months    Home Medications Prior to Admission medications   Medication Sig Start Date End Date Taking? Authorizing Provider  Lancets (FREESTYLE) lancets Use as instructed 02/27/19   Tacy Learn, PA-C    Allergies      Penicillins  Review of Systems   Review of Systems  Constitutional: Negative for chills, decreased responsiveness, diaphoresis and fever.  HENT: Negative for ear pain and sore throat.   Eyes: Negative for pain and visual disturbance.  Respiratory: Negative for cough and shortness of breath.   Cardiovascular: Negative for chest pain and palpitations.  Gastrointestinal: Negative for abdominal pain and vomiting.  Genitourinary: Negative for dysuria and hematuria.  Musculoskeletal: Negative for arthralgias and back pain.  Skin: Negative for color change and rash.  Neurological: Negative for dizziness, tremors, seizures, syncope, speech difficulty and weakness.  Psychiatric/Behavioral: The patient is not nervous/anxious.   All other systems reviewed and are negative.   Physical Exam Updated Vital Signs BP 139/90   Pulse 63   Temp (!) 97.5 F (36.4 C) (Oral)   Resp 18   Ht 6\' 1"  (1.854 m)   Wt 93 kg   SpO2 96%   BMI 27.05 kg/m   Physical Exam Vitals and nursing note reviewed.  Constitutional:      General: He is not in acute distress.    Appearance: He is well-developed. He is not ill-appearing.  HENT:     Head: Normocephalic and atraumatic.     Nose: Nose normal.     Mouth/Throat:     Mouth: Mucous membranes are moist.  Eyes:     Extraocular  Movements: Extraocular movements intact.     Conjunctiva/sclera: Conjunctivae normal.     Pupils: Pupils are equal, round, and reactive to light.  Cardiovascular:     Rate and Rhythm: Normal rate and regular rhythm.     Pulses: Normal pulses.     Heart sounds: Normal heart sounds. No murmur.  Pulmonary:     Effort: Pulmonary effort is normal. No respiratory distress.     Breath sounds: Normal breath sounds.  Abdominal:     General: Abdomen is flat.     Palpations: Abdomen is soft.     Tenderness: There is no abdominal tenderness.  Musculoskeletal:        General: No tenderness. Normal range of motion.     Cervical back:  Normal range of motion and neck supple.  Skin:    General: Skin is warm and dry.     Capillary Refill: Capillary refill takes less than 2 seconds.  Neurological:     General: No focal deficit present.     Mental Status: He is alert and oriented to person, place, and time.     Cranial Nerves: No cranial nerve deficit.     Sensory: No sensory deficit.     Motor: No weakness.     Coordination: Coordination normal.     Comments: 5+ out of 5 strength throughout, normal sensation, no drift, normal finger-to-nose finger  Psychiatric:        Mood and Affect: Mood normal.     ED Results / Procedures / Treatments   Labs (all labs ordered are listed, but only abnormal results are displayed) Labs Reviewed  CBG MONITORING, ED - Abnormal; Notable for the following components:      Result Value   Glucose-Capillary 113 (*)    All other components within normal limits  CBG MONITORING, ED  CBG MONITORING, ED    EKG None  Radiology No results found.  Procedures Procedures (including critical care time)  Medications Ordered in ED Medications - No data to display  ED Course  I have reviewed the triage vital signs and the nursing notes.  Pertinent labs & imaging results that were available during my care of the patient were reviewed by me and considered in my medical decision making (see chart for details).    MDM Rules/Calculators/A&P                      Dillon Carson is a 39 year old male with history of polysubstance abuse, hypoglycemia who presents to the ED after hypoglycemic event.  Patient with unremarkable vitals.  No fever.  Asymptomatic upon my evaluation.  Patient states that he was at work and felt weakness in his legs.  Felt like his blood sugar was low.  He checked it, blood sugar was 59.  He took his oral sugar tablets and symptoms improved.  Then his blood sugar was in the 230s and was concerned.  He arrives here with normal blood sugar.  Blood sugar 72.  Patient has a  history of low blood sugars but has never passed out from them in the past.  Typically checks his blood sugar when he feels weak and takes oral sugar as needed.  Has never been diagnosed with any endocrine disorder.  He has been dealing with this for about a year or so.  Does not have a primary care doctor.  Has never seen endocrinology.  Will recheck blood sugar in about an hour to see if there is any  major change.  Otherwise will arrange for ambulatory referral to endocrinology to further evaluate why patient has hypoglycemic events.  Patient has glucometer and glucose tablets.  Repeat blood sugar 1 hour after arrival is normal at 82.  Pacing was fasting during this hour check.  Given reassurance.  Referral to endocrinology has been placed.  Has been given information to wellness center.  This chart was dictated using voice recognition software.  Despite best efforts to proofread,  errors can occur which can change the documentation meaning.    Final Clinical Impression(s) / ED Diagnoses Final diagnoses:  Hypoglycemia    Rx / DC Orders ED Discharge Orders         Ordered    Ambulatory referral to Endocrinology    Comments: History of hypoglycemic events over the past 1-2 years   02/05/20 [address] DO 02/05/20 1241

## 2020-02-06 ENCOUNTER — Emergency Department (HOSPITAL_COMMUNITY)
Admission: EM | Admit: 2020-02-06 | Discharge: 2020-02-06 | Discharge status: Absent without leave | Payer: Medicaid Other

## 2020-02-06 ENCOUNTER — Other Ambulatory Visit: Payer: Self-pay

## 2020-05-01 ENCOUNTER — Ambulatory Visit: Payer: Medicaid Other | Attending: Adult Medicine | Admitting: Physical Therapy

## 2020-05-13 ENCOUNTER — Encounter: Payer: Self-pay | Admitting: Endocrinology

## 2020-05-28 ENCOUNTER — Emergency Department (HOSPITAL_COMMUNITY)
Admission: EM | Admit: 2020-05-28 | Discharge: 2020-05-29 | Disposition: A | Discharge status: Home / Self Care | Payer: Medicaid Other | Attending: Emergency Medicine | Admitting: Emergency Medicine

## 2020-05-28 ENCOUNTER — Encounter (HOSPITAL_COMMUNITY): Payer: Self-pay

## 2020-05-28 ENCOUNTER — Other Ambulatory Visit: Payer: Self-pay

## 2020-05-28 ENCOUNTER — Emergency Department (HOSPITAL_COMMUNITY): Payer: Medicaid Other

## 2020-05-28 DIAGNOSIS — R42 Dizziness and giddiness: Secondary | ICD-10-CM | POA: Insufficient documentation

## 2020-05-28 DIAGNOSIS — Z5321 Procedure and treatment not carried out due to patient leaving prior to being seen by health care provider: Secondary | ICD-10-CM | POA: Diagnosis not present

## 2020-05-28 DIAGNOSIS — E860 Dehydration: Secondary | ICD-10-CM | POA: Insufficient documentation

## 2020-05-28 DIAGNOSIS — R0789 Other chest pain: Secondary | ICD-10-CM | POA: Insufficient documentation

## 2020-05-28 DIAGNOSIS — H538 Other visual disturbances: Secondary | ICD-10-CM | POA: Insufficient documentation

## 2020-05-28 LAB — TROPONIN I (HIGH SENSITIVITY): Troponin I (High Sensitivity): 2 ng/L (ref <18)

## 2020-05-28 LAB — BASIC METABOLIC PANEL
Anion gap: 11 (ref 5–15)
BUN: 21 mg/dL — ABNORMAL HIGH (ref 6–20)
CO2: 25 mmol/L (ref 22–32)
Calcium: 9.2 mg/dL (ref 8.9–10.3)
Chloride: 103 mmol/L (ref 98–111)
Creatinine, Ser: 1.07 mg/dL (ref 0.61–1.24)
GFR calc Af Amer: >60 mL/min (ref >60)
GFR calc non Af Amer: >60 mL/min (ref >60)
Glucose, Bld: 118 mg/dL — ABNORMAL HIGH (ref 70–99)
Potassium: 4.1 mmol/L (ref 3.5–5.1)
Sodium: 139 mmol/L (ref 135–145)

## 2020-05-28 LAB — CBC
HCT: 48.5 % (ref 39.0–52.0)
Hemoglobin: 16.6 g/dL (ref 13.0–17.0)
MCH: 31.5 pg (ref 26.0–34.0)
MCHC: 34.2 g/dL (ref 30.0–36.0)
MCV: 92 fL (ref 80.0–100.0)
Platelets: 173 10*3/uL (ref 150–400)
RBC: 5.27 MIL/uL (ref 4.22–5.81)
RDW: 12.5 % (ref 11.5–15.5)
WBC: 5.9 10*3/uL (ref 4.0–10.5)
nRBC: 0 % (ref 0.0–0.2)

## 2020-05-28 NOTE — ED Triage Notes (Signed)
Patient arrived stating that today a work around 5pm he began feeling dizziness, blurred vision, and his chest got tight. Patient concerned he is dehydrated.

## 2022-03-12 DEATH — deceased

## 2022-05-01 IMAGING — CR DG CHEST 2V
2 series · 2 of 2 positions shown · non-contrast
Comparison: 05/12/2018

CLINICAL DATA: Chest pain

EXAM:
CHEST - 2 VIEW

[w chest pa]
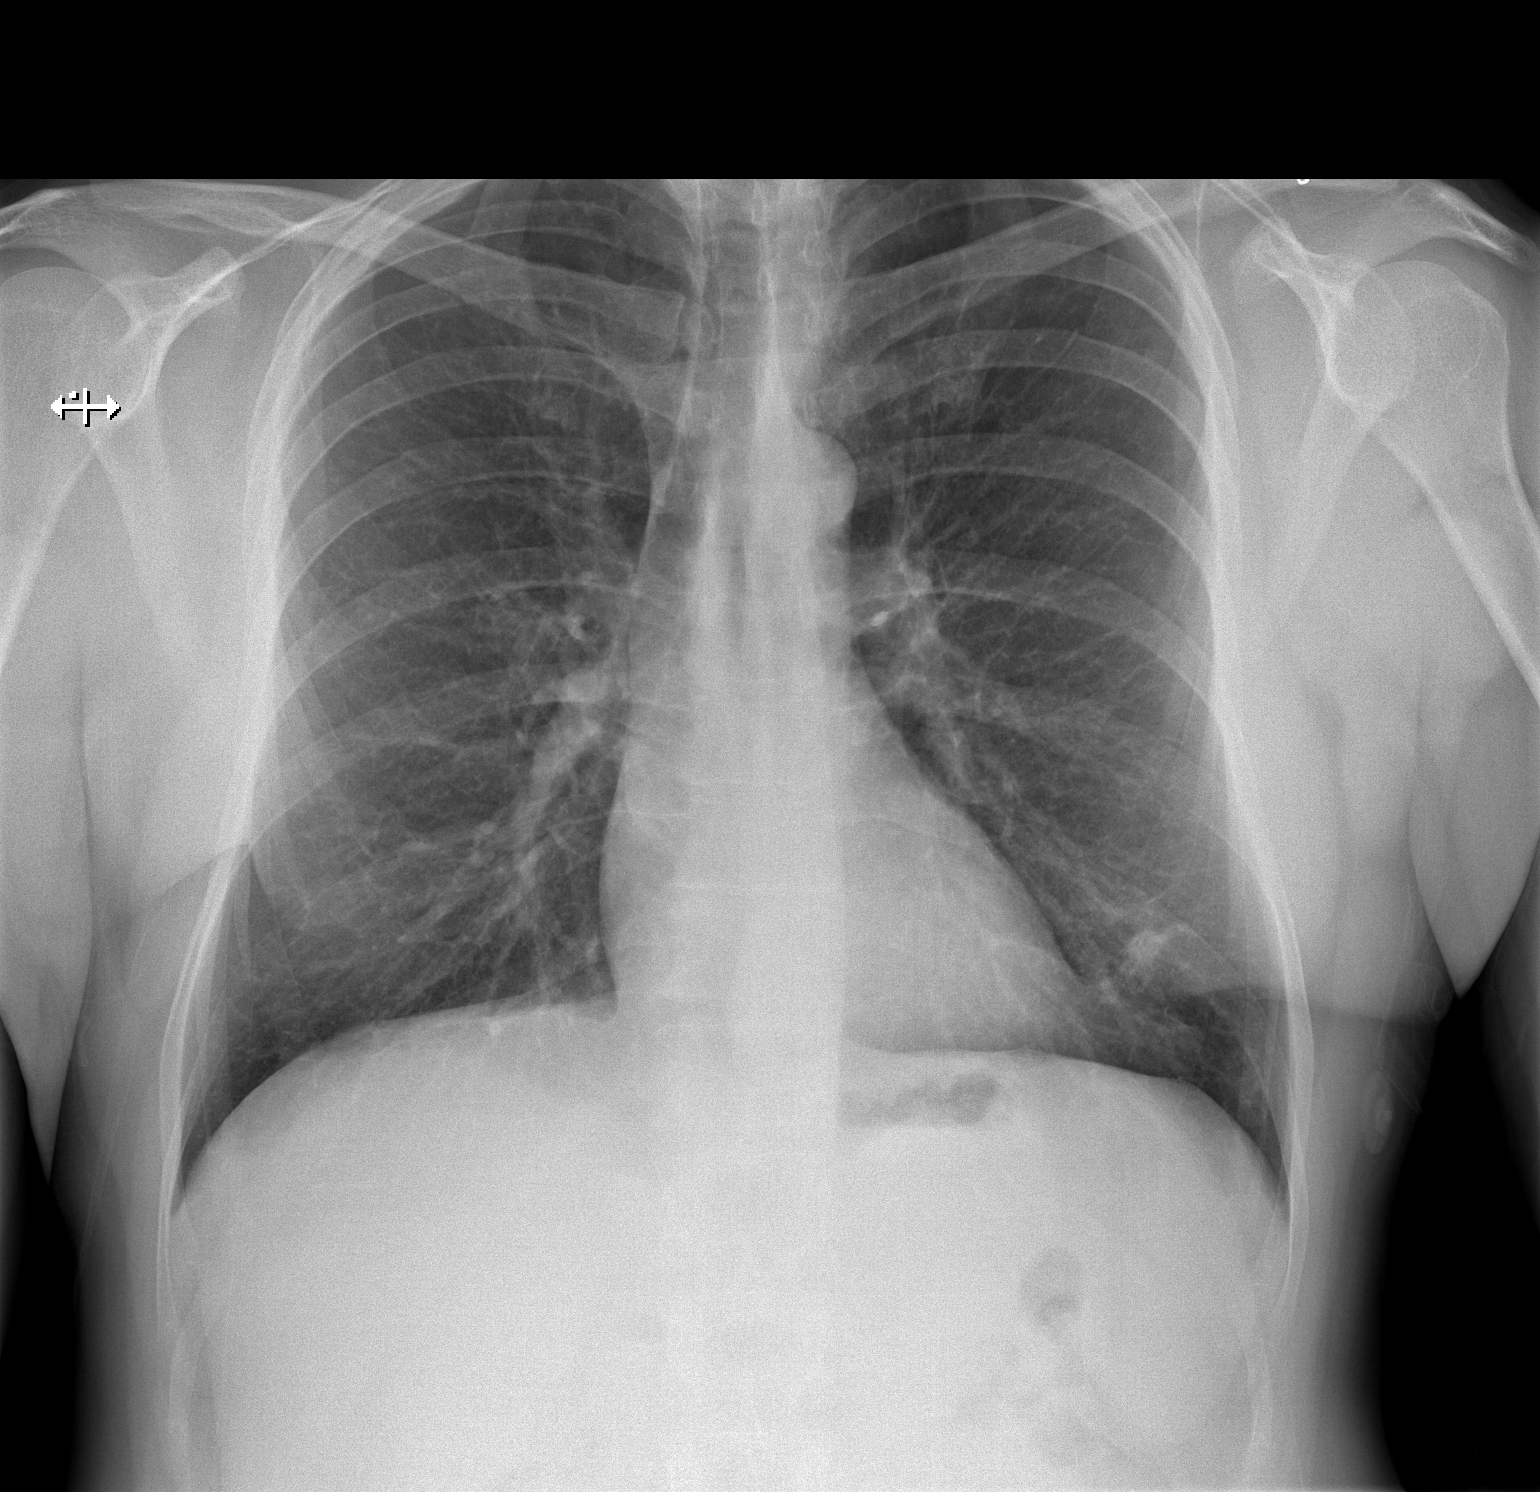

[w chest lat]
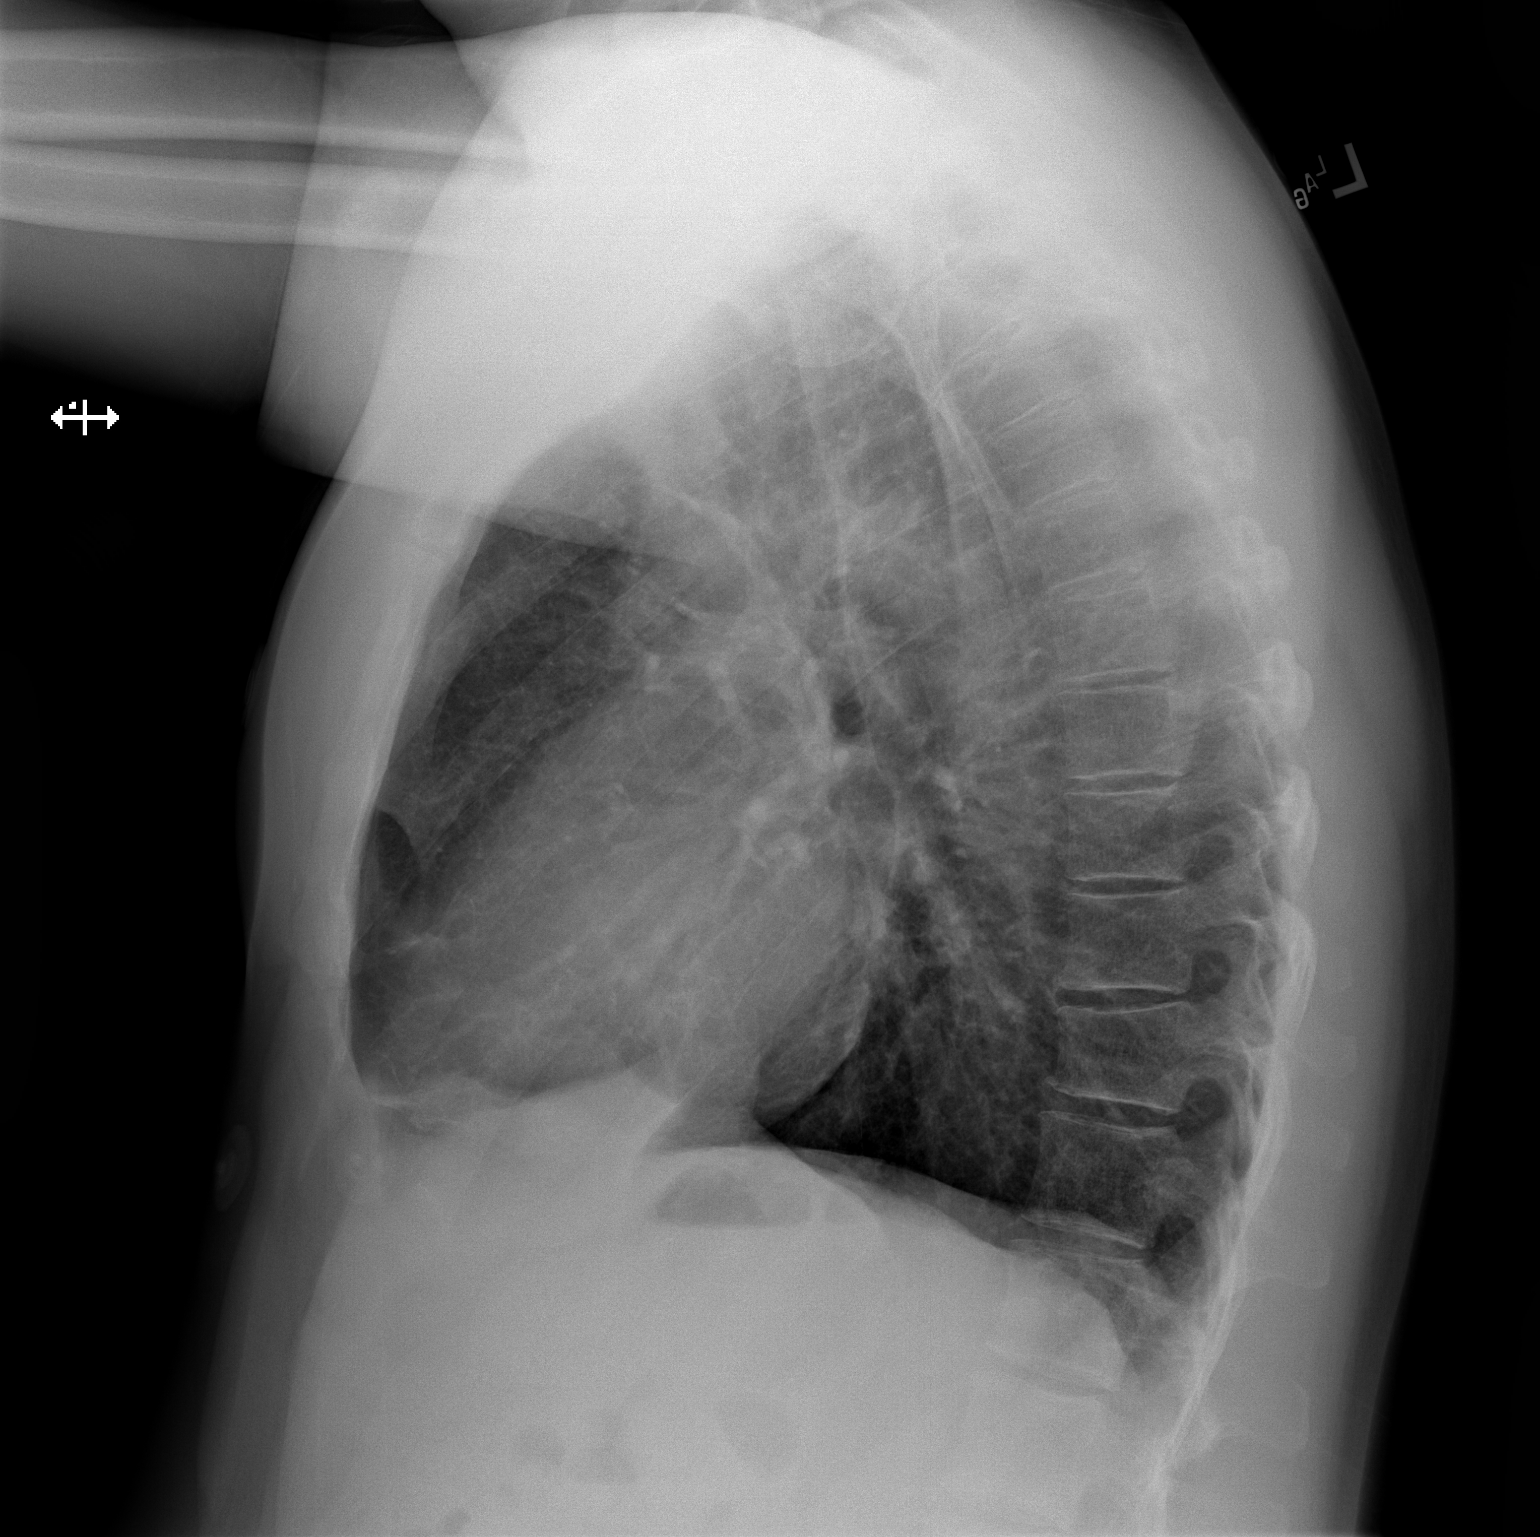

[2 of 2 positions shown; findings below may reference images not displayed]

FINDINGS: There is a nodular opacity at the left lung base. Lungs are
otherwise clear. Normal pleural spaces in cardiomediastinal
contours.
IMPRESSION: Nodular opacity at the left lung base. Chest CT recommended for
further characterization.
